# Patient Record
Sex: Male | Born: 1945 | Race: White | Hispanic: No | Smoking: Current every day smoker
Health system: Southern US, Community
[De-identification: ages and names within clinical notes are randomized; demographics above are authoritative.]

## PROBLEM LIST (undated history)

## (undated) DIAGNOSIS — C61 Malignant neoplasm of prostate: Secondary | ICD-10-CM

## (undated) HISTORY — PX: PROSTATE BIOPSY: SHX241

---

## 2021-01-03 DIAGNOSIS — I509 Heart failure, unspecified: Secondary | ICD-10-CM | POA: Insufficient documentation

## 2021-01-03 DIAGNOSIS — E782 Mixed hyperlipidemia: Secondary | ICD-10-CM | POA: Insufficient documentation

## 2021-01-03 DIAGNOSIS — J439 Emphysema, unspecified: Secondary | ICD-10-CM | POA: Insufficient documentation

## 2021-01-03 DIAGNOSIS — Z794 Long term (current) use of insulin: Secondary | ICD-10-CM | POA: Insufficient documentation

## 2021-02-20 DIAGNOSIS — M545 Low back pain, unspecified: Secondary | ICD-10-CM | POA: Insufficient documentation

## 2021-03-06 DIAGNOSIS — C61 Malignant neoplasm of prostate: Secondary | ICD-10-CM | POA: Insufficient documentation

## 2021-04-13 ENCOUNTER — Telehealth: Payer: Self-pay | Admitting: Oncology

## 2021-04-13 NOTE — Telephone Encounter (Signed)
Scheduled appt per 6/14 referral. Pt aware.

## 2021-04-18 ENCOUNTER — Other Ambulatory Visit: Payer: Self-pay | Admitting: Oncology

## 2021-04-18 ENCOUNTER — Other Ambulatory Visit: Payer: Self-pay | Admitting: Radiation Oncology

## 2021-04-18 ENCOUNTER — Inpatient Hospital Stay: Payer: Medicare HMO | Attending: Oncology | Admitting: Oncology

## 2021-04-18 ENCOUNTER — Ambulatory Visit
Admission: RE | Admit: 2021-04-18 | Discharge: 2021-04-18 | Disposition: A | Discharge status: Home / Self Care | Payer: Self-pay | Source: Ambulatory Visit | Attending: Radiation Oncology | Admitting: Radiation Oncology

## 2021-04-18 ENCOUNTER — Other Ambulatory Visit: Payer: Self-pay

## 2021-04-18 VITALS — BP 158/58 | HR 73 | Temp 97.6°F | Resp 18 | Wt 205.9 lb

## 2021-04-18 DIAGNOSIS — C61 Malignant neoplasm of prostate: Secondary | ICD-10-CM | POA: Diagnosis not present

## 2021-04-18 DIAGNOSIS — C775 Secondary and unspecified malignant neoplasm of intrapelvic lymph nodes: Secondary | ICD-10-CM | POA: Insufficient documentation

## 2021-04-18 DIAGNOSIS — Z5111 Encounter for antineoplastic chemotherapy: Secondary | ICD-10-CM | POA: Insufficient documentation

## 2021-04-18 NOTE — Progress Notes (Signed)
Reason for the request:    Prostate cancer  HPI: I was asked by Dr. Rosana Hoes to evaluate Eric Fischer for the evaluation of prostate cancer.  He is a 75 year old with a history of COPD, diabetes among other comorbid conditions.  He was evaluated by Dr. Tresa Endo in May 2022 for the presumed diagnosis of prostate cancer.  He had a screening PSA in April 2022 was 70.5.  His digital rectal examination showed a firm prostate and repeat PSA on Mar 08, 2021 was 61.14.  He underwent a staging PET scan on Mar 21, 2021 showed bilateral pelvic lymph node compatible with metastatic disease identified within the posterior gland.  There are 2 nodules within the superior segment of the lower lobe with are intermediate.  The lymph nodes involved including 6 mm left common iliac, left pelvic sidewall, 6 mm pelvic sidewall and a 5 mm right pelvic sidewall.  He reports a remote history of prostate cancer in the last 10 years.  He has been following with a urologist in [place] where a biopsy was performed and has been on active surveillance.  The details of his prostate cancer diagnosis not available to [place] at this point.  He did not receive any treatment however.  Clinically, he reports chronic complaints of back and hip discomfort as well as neuropathy.  He does report frequency of urination but no nocturia or dysuria.  His performance status is reasonable and ambulates with the help of cane.   He does not report any headaches, blurry vision, syncope or seizures. Does not report any fevers, chills or sweats.  Does not report any cough, wheezing or hemoptysis.  Does not report any chest pain, palpitation, orthopnea or leg edema.  Does not report any nausea, vomiting or abdominal pain.  Does not report any constipation or diarrhea.  Does not report any skeletal complaints.    Does not report frequency, urgency or hematuria.  Does not report any skin rashes or lesions. Does not report any heat or cold intolerance.  Does not report  any lymphadenopathy or petechiae.  Does not report any anxiety or depression.  Remaining review of systems is negative.    Past medical history significant for diabetes and COPD.  He has also history of neuropathy and currently on Neurontin.  Medication reviewed including aspirin, Lipitor Flexeril, Lasix, Neurontin and Lantus insulin.  He is also on Cozaar, Mobic, Toprol all and inhalers.  Social History   Socioeconomic History   Marital status: Divorced    Spouse name: Not on file   Number of children: Not on file   Years of education: Not on file   Highest education level: Not on file  Occupational History   Not on file  Tobacco Use   Smoking status: Not on file   Smokeless tobacco: Not on file  Substance and Sexual Activity   Alcohol use: Not on file   Drug use: Not on file   Sexual activity: Not on file  Other Topics Concern   Not on file  Social History Narrative   Not on file   Social Determinants of Health   Financial Resource Strain: Not on file  Food Insecurity: Not on file  Transportation Needs: Not on file  Physical Activity: Not on file  Stress: Not on file  Social Connections: Not on file  Intimate Partner Violence: Not on file  :  Pertinent items are noted in HPI.  Exam: Blood pressure (!) 158/58, pulse 73, temperature 97.6 F (36.4  C), temperature source Tympanic, resp. rate 18, weight 205 lb 14.4 oz (93.4 kg), SpO2 95 %. ECOG 1 General appearance: alert and cooperative appeared without distress. Head: atraumatic without any abnormalities. Eyes: conjunctivae/corneas clear. PERRL.  Sclera anicteric. Throat: lips, mucosa, and tongue normal; without oral thrush or ulcers. Resp: clear to auscultation bilaterally without rhonchi, wheezes or dullness to percussion. Cardio: regular rate and rhythm, S1, S2 normal, no murmur, click, rub or gallop GI: soft, non-tender; bowel sounds normal; no masses,  no organomegaly Skin: Skin color, texture, turgor normal.  No rashes or lesions Lymph nodes: Cervical, supraclavicular, and axillary nodes normal. Neurologic: Grossly normal without any motor, sensory or deep tendon reflexes. Musculoskeletal: No joint deformity or effusion.  IMPRESSION:  1. Examination is positive for tracer bilateral pelvic lymph nodes  compatible with metastatic adenopathy. Intense tracer uptake is  identified within the posterior prostate gland.  2. There are 2 nodules within the superior segment of left lower  lobe which are indeterminate. Only mild tracer uptake is associated  with these nodules. Cannot rule out primary bronchogenic carcinoma.  Recommend further evaluation with dedicated CT of the chest.  3. Signs of chronic granulomatous disease.  4. Infrarenal abdominal aortic aneurysm. Recommend follow-up  ultrasound every 3 years. This recommendation follows ACR consensus  guidelines: White Paper of the ACR Incidental Findings Committee II  on Vascular Findings. J Am Coll Radiol 2013; 10:789-794. Aortic  Atherosclerosis (ICD10-I70.0).  5. Aortic atherosclerosis and coronary artery calcifications. Aortic  Atherosclerosis (ICD10-I70.0  Assessment and Plan:   75 year old man with:  1.  Advanced prostate cancer diagnosed in May 2022.  He presented with a PSA of 61 and pelvic adenopathy based on a PSMA PET scan obtained in May 2022.  This indicates castration-sensitive disease.   Treatment options were discussed at this time and the natural course of this disease was reviewed.  He clearly has advanced disease but does not appear to have widespread metastasis including bone or visceral organs.  His advanced disease is limited to pelvic lymphadenopathy.  Systemic therapy remains the main backbone to treat his disease especially with androgen deprivation.  Complication associated with this option include fatigue, weight gain, osteoporosis and sexual dysfunction.  The plan is to start androgen deprivation therapy in the  immediate future.  He will receive Firmagon and switch to Lupron subsequently every 6 months.  The role for therapy escalation was also discussed in the form of androgen receptor pathway inhibitors versus chemotherapy.  This will be initiated at a later date once androgen deprivation therapy has commenced and has reasonable tolerance to it.  The rationale for also treating his primary tumor was also discussed.  He has limited metastatic disease including pelvic lymph nodes and it would be reasonable to consider treating his primary with radiation therapy.  I will make a referral to radiation oncology to consider treatment of his primary prostate cancer in the near future.  If radiation felt is not reasonable we will proceed with systemic therapy alone.  The role for obtaining tissue biopsy was also discussed but given now he has history I do not have any objection to proceeding with treatment without tissue biopsy.   2.  Androgen deprivation therapy: Risks and benefits of proceeding without discussed at this time.  Complications that include weight gain, hot flashes and others were reviewed.  He will receive Mills Koller in the future and transition into Lupron.  3.  Bone directed therapy: Recommended calcium and vitamin D supplements to combat osteoporosis  associated with androgen deprivation.  4.  Lower urinary tract symptoms: Appears to be reasonably controlled at this time.  He does have urinary frequency however.  5.  Follow-up: We will be in the next 2 months to follow his progress.  60  minutes were dedicated to this visit. The time was spent on reviewing laboratory data, imaging studies, discussing treatment options, and answering questions regarding future plan.     A copy of this consult has been forwarded to the requesting physician.

## 2021-04-20 ENCOUNTER — Telehealth: Payer: Self-pay | Admitting: Oncology

## 2021-04-20 NOTE — Telephone Encounter (Signed)
Scheduled per 06/22 los, called patients daughter. Patient will be notified of upcoming appointments.

## 2021-04-24 ENCOUNTER — Telehealth: Payer: Self-pay | Admitting: Radiation Oncology

## 2021-04-25 ENCOUNTER — Encounter: Payer: Self-pay | Admitting: Oncology

## 2021-04-25 ENCOUNTER — Inpatient Hospital Stay: Payer: Medicare HMO

## 2021-04-25 ENCOUNTER — Other Ambulatory Visit: Payer: Self-pay

## 2021-04-25 VITALS — BP 159/60 | HR 62 | Resp 18

## 2021-04-25 DIAGNOSIS — C61 Malignant neoplasm of prostate: Secondary | ICD-10-CM | POA: Diagnosis not present

## 2021-04-25 DIAGNOSIS — C775 Secondary and unspecified malignant neoplasm of intrapelvic lymph nodes: Secondary | ICD-10-CM | POA: Diagnosis not present

## 2021-04-25 DIAGNOSIS — Z5111 Encounter for antineoplastic chemotherapy: Secondary | ICD-10-CM | POA: Diagnosis present

## 2021-04-25 MED ORDER — DEGARELIX ACETATE(240 MG DOSE) 120 MG/VIAL ~~LOC~~ SOLR
240.0000 mg | Freq: Once | SUBCUTANEOUS | Status: AC
  Administered 2021-04-25: 240 mg via SUBCUTANEOUS
  Filled 2021-04-25: qty 6

## 2021-04-25 NOTE — Progress Notes (Signed)
Met with patient at registration on Lenise's behalf to obtain income for grant.  Patient approved for one-time $1000 grant to assist with personal expenses while going through treatment and an additional $200 grant for medication and gas cards only. He received 2 gift cards today. Discussed in detail expenses and how they are covered. He has a copy of the approval letter and expense sheet along with the Outpatient pharmacy information.  He has Lenise's card for any additional financial questions or concerns.

## 2021-04-25 NOTE — Progress Notes (Signed)
Called pt to introduce myself as his Arboriculturist.  Pt has 2 insurances so copay assistance shouldn't be needed.  I spoke to his sister and informed her of the [employer], went over what it covers and gave her the income requirement.  She said pt can use gas cards so he would like to apply, he will bring his proof of income to his visit today.  If approved I will give him an expense sheet and my card for any questions or concerns he may have in the future.

## 2021-04-25 NOTE — Patient Instructions (Signed)
Degarelix injection What is this medication? DEGARELIX (deg a REL ix) is used to treat men with advanced prostate cancer. This medicine may be used for other purposes; ask your health care provider orpharmacist if you have questions. COMMON BRAND NAME(S): Degarelix, Mills Koller What should I tell my care team before I take this medication? They need to know if you have any of these conditions: diabetes heart disease kidney disease liver disease low levels of potassium or magnesium in the blood osteoporosis an unusual or allergic reaction to degarelix, mannitol, other medicines, foods, dyes, or preservatives pregnant or trying to get pregnant breast-feeding How should I use this medication? This medicine is for injection under the skin. It is usually given by a healthcare professional in a hospital or clinic setting. If you get this medicine at home, you will be taught how to prepare and give this medicine. Use exactly as directed. Take your medicine at regularintervals. Do not take it more often than directed. It is important that you put your used needles and syringes in a special sharps container. Do not put them in a trash can. If you do not have a sharpscontainer, call your pharmacist or healthcare provider to get one. Talk to your pediatrician regarding the use of this medicine in children.Special care may be needed. Overdosage: If you think you have taken too much of this medicine contact apoison control center or emergency room at once. NOTE: This medicine is only for you. Do not share this medicine with others. What if I miss a dose? Try not to miss a dose. If you do miss a dose, call your doctor or health careprofessional for advice. What may interact with this medication? Do not take this medicine with any of the following medications: cisapride dronedarone pimozide thioridazine This medicine may also interact with the following medications: other medicines that prolong the QT  interval (abnormal heart rhythm) This list may not describe all possible interactions. Give your health care provider a list of all the medicines, herbs, non-prescription drugs, or dietary supplements you use. Also tell them if you smoke, drink alcohol, or use illegaldrugs. Some items may interact with your medicine. What should I watch for while using this medication? Visit your doctor or health care professional for regular checks on yourprogress and discuss any issues before you start taking this medicine. Do not rub or scratch injection site. There may be a lump at the injectionsite, or it may be red or sore for a few days after your dose. Your doctor or health care professional will need to monitor your hormone levels in your blood to check your response to treatment. Try to keep anyappointments for testing. What side effects may I notice from receiving this medication? Side effects that you should report to your doctor or health care professionalas soon as possible: allergic reactions like skin rash, itching or hives, swelling of the face, lips, or tongue fever or chills irregular heartbeat nausea and vomiting along with severe abdominal pain pain or difficulty passing urine pelvic pain or bloating signs and symptoms of high blood sugar such as being more thirsty or hungry or having to urinate more than normal. You may also feel very tired or have blurry vision Side effects that usually do not require medical attention (report to yourdoctor or health care professional if they continue or are bothersome): change in sex drive or performance constipation headache high blood pressure hot flashes (flushing of skin, increased sweating) itching, redness or mild pain at site where  injected joint pain trouble sleeping unusually weak or tired weight gain This list may not describe all possible side effects. Call your doctor for medical advice about side effects. You may report side effects to  FDA at1-800-FDA-1088. Where should I keep my medication? Keep out of the reach of children. This drug is usually given in a hospital or clinic and will not be stored athome. In rare cases, this medicine may be given at home. If you are using this medicine at home, you will be instructed on how to store this medicine. Throwaway any unused medicine after the expiration date on the label. NOTE: This sheet is a summary. It may not cover all possible information. If you have questions about this medicine, talk to your doctor, pharmacist, orhealth care provider.  2022 Elsevier/Gold Standard (2019-09-13 19:44:17)

## 2021-04-30 ENCOUNTER — Other Ambulatory Visit: Payer: Self-pay

## 2021-04-30 ENCOUNTER — Emergency Department (HOSPITAL_COMMUNITY)
Admission: EM | Admit: 2021-04-30 | Discharge: 2021-04-30 | Disposition: A | Discharge status: Home / Self Care | Payer: Medicare HMO | Attending: Emergency Medicine | Admitting: Emergency Medicine

## 2021-04-30 ENCOUNTER — Emergency Department (HOSPITAL_COMMUNITY): Payer: Medicare HMO

## 2021-04-30 DIAGNOSIS — I509 Heart failure, unspecified: Secondary | ICD-10-CM | POA: Insufficient documentation

## 2021-04-30 DIAGNOSIS — E1165 Type 2 diabetes mellitus with hyperglycemia: Secondary | ICD-10-CM | POA: Diagnosis not present

## 2021-04-30 DIAGNOSIS — Z794 Long term (current) use of insulin: Secondary | ICD-10-CM | POA: Insufficient documentation

## 2021-04-30 DIAGNOSIS — R222 Localized swelling, mass and lump, trunk: Secondary | ICD-10-CM | POA: Diagnosis not present

## 2021-04-30 DIAGNOSIS — N289 Disorder of kidney and ureter, unspecified: Secondary | ICD-10-CM | POA: Insufficient documentation

## 2021-04-30 DIAGNOSIS — Z7984 Long term (current) use of oral hypoglycemic drugs: Secondary | ICD-10-CM | POA: Diagnosis not present

## 2021-04-30 DIAGNOSIS — T50905A Adverse effect of unspecified drugs, medicaments and biological substances, initial encounter: Secondary | ICD-10-CM

## 2021-04-30 DIAGNOSIS — R140 Abdominal distension (gaseous): Secondary | ICD-10-CM | POA: Insufficient documentation

## 2021-04-30 DIAGNOSIS — J40 Bronchitis, not specified as acute or chronic: Secondary | ICD-10-CM | POA: Diagnosis not present

## 2021-04-30 DIAGNOSIS — C61 Malignant neoplasm of prostate: Secondary | ICD-10-CM

## 2021-04-30 DIAGNOSIS — J449 Chronic obstructive pulmonary disease, unspecified: Secondary | ICD-10-CM | POA: Diagnosis not present

## 2021-04-30 DIAGNOSIS — Z20822 Contact with and (suspected) exposure to covid-19: Secondary | ICD-10-CM | POA: Diagnosis not present

## 2021-04-30 DIAGNOSIS — R0602 Shortness of breath: Secondary | ICD-10-CM | POA: Diagnosis present

## 2021-04-30 DIAGNOSIS — E1142 Type 2 diabetes mellitus with diabetic polyneuropathy: Secondary | ICD-10-CM | POA: Diagnosis not present

## 2021-04-30 DIAGNOSIS — R7989 Other specified abnormal findings of blood chemistry: Secondary | ICD-10-CM | POA: Diagnosis not present

## 2021-04-30 DIAGNOSIS — Z7982 Long term (current) use of aspirin: Secondary | ICD-10-CM | POA: Insufficient documentation

## 2021-04-30 LAB — CBC WITH DIFFERENTIAL/PLATELET
Abs Immature Granulocytes: 0.04 10*3/uL (ref 0.00–0.07)
Basophils Absolute: 0.1 10*3/uL (ref 0.0–0.1)
Basophils Relative: 1 %
Eosinophils Absolute: 0.2 10*3/uL (ref 0.0–0.5)
Eosinophils Relative: 3 %
HCT: 44.2 % (ref 39.0–52.0)
Hemoglobin: 14.4 g/dL (ref 13.0–17.0)
Immature Granulocytes: 1 %
Lymphocytes Relative: 17 %
Lymphs Abs: 1.2 10*3/uL (ref 0.7–4.0)
MCH: 29.9 pg (ref 26.0–34.0)
MCHC: 32.6 g/dL (ref 30.0–36.0)
MCV: 91.9 fL (ref 80.0–100.0)
Monocytes Absolute: 0.5 10*3/uL (ref 0.1–1.0)
Monocytes Relative: 8 %
Neutro Abs: 4.9 10*3/uL (ref 1.7–7.7)
Neutrophils Relative %: 70 %
Platelets: 201 10*3/uL (ref 150–400)
RBC: 4.81 MIL/uL (ref 4.22–5.81)
RDW: 14.4 % (ref 11.5–15.5)
WBC: 6.9 10*3/uL (ref 4.0–10.5)
nRBC: 0 % (ref 0.0–0.2)

## 2021-04-30 LAB — RESP PANEL BY RT-PCR (FLU A&B, COVID) ARPGX2
Influenza A by PCR: NEGATIVE
Influenza B by PCR: NEGATIVE
SARS Coronavirus 2 by RT PCR: NEGATIVE

## 2021-04-30 LAB — COMPREHENSIVE METABOLIC PANEL
ALT: 11 U/L (ref 0–44)
AST: 16 U/L (ref 15–41)
Albumin: 3.2 g/dL — ABNORMAL LOW (ref 3.5–5.0)
Alkaline Phosphatase: 71 U/L (ref 38–126)
Anion gap: 6 (ref 5–15)
BUN: 26 mg/dL — ABNORMAL HIGH (ref 8–23)
CO2: 28 mmol/L (ref 22–32)
Calcium: 9.5 mg/dL (ref 8.9–10.3)
Chloride: 99 mmol/L (ref 98–111)
Creatinine, Ser: 1.32 mg/dL — ABNORMAL HIGH (ref 0.61–1.24)
GFR, Estimated: 56 mL/min — ABNORMAL LOW (ref >60)
Glucose, Bld: 347 mg/dL — ABNORMAL HIGH (ref 70–99)
Potassium: 5 mmol/L (ref 3.5–5.1)
Sodium: 133 mmol/L — ABNORMAL LOW (ref 135–145)
Total Bilirubin: 0.6 mg/dL (ref 0.3–1.2)
Total Protein: 6.8 g/dL (ref 6.5–8.1)

## 2021-04-30 LAB — LACTIC ACID, PLASMA
Lactic Acid, Venous: 1.9 mmol/L (ref 0.5–1.9)
Lactic Acid, Venous: 1.3 mmol/L (ref 0.5–1.9)

## 2021-04-30 LAB — TROPONIN I (HIGH SENSITIVITY)
Troponin I (High Sensitivity): 66 ng/L — ABNORMAL HIGH (ref <18)
Troponin I (High Sensitivity): 60 ng/L — ABNORMAL HIGH (ref <18)

## 2021-04-30 LAB — CBG MONITORING, ED: Glucose-Capillary: 191 mg/dL — ABNORMAL HIGH (ref 70–99)

## 2021-04-30 MED ORDER — AMOXICILLIN-POT CLAVULANATE 875-125 MG PO TABS: 1.0000 | ORAL_TABLET | Freq: Two times a day (BID) | ORAL | 0 refills | Status: AC

## 2021-04-30 MED ORDER — IOHEXOL 350 MG/ML SOLN
98.0000 mL | Freq: Once | INTRAVENOUS | Status: AC | PRN
  Administered 2021-04-30: 98 mL via INTRAVENOUS

## 2021-04-30 MED ORDER — TRAMADOL HCL 50 MG PO TABS: 50.0000 mg | ORAL_TABLET | Freq: Four times a day (QID) | ORAL | 0 refills | Status: AC | PRN

## 2021-04-30 MED ORDER — AMOXICILLIN-POT CLAVULANATE 875-125 MG PO TABS
1.0000 | ORAL_TABLET | Freq: Once | ORAL | Status: AC
  Administered 2021-04-30: 1 via ORAL
  Filled 2021-04-30: qty 1

## 2021-04-30 NOTE — ED Notes (Signed)
Dc instructions reviewed with pt. Pt verbalized understanding. PT DC>  

## 2021-04-30 NOTE — ED Provider Notes (Signed)
Emergency Medicine Provider Triage Evaluation Note  Eric Fischer , a 75 y.o. male  was evaluated in triage.  Pt complains of shortness of breath, lower abdominal pain.  Sent by oncologist due to worsening shortness of breath rule out blood clot in the lung.  Also reports redness pain and swelling in the lower abdomen he has been getting injections there for treatment of his prostate cancer.  Review of Systems  Positive: Shortness of breath, lower abdominal pain, redness, swelling Negative: Fever  Physical Exam  BP (!) 148/99   Pulse 64   Temp 98.2 F (36.8 C) (Oral)   Resp (!) 22   SpO2 97%  Gen:   Awake, chronically ill-appearing Resp:  Increased respiratory effort, coughing frequently MSK:   Moves extremities without difficulty  Other:  Abdomen is distended and tender, redness across the lower abdominal wall and pannus that is indurated and tender to the touch.  Medical Decision Making  Medically screening exam initiated at 2:34 PM.  Appropriate orders placed.  Eric Fischer was informed that the remainder of the evaluation will be completed by another provider, this initial triage assessment does not replace that evaluation, and the importance of remaining in the ED until their evaluation is complete.     Jacqlyn Larsen, PA-C 04/30/21 1441    Varney Biles, MD 05/01/21 [phone]

## 2021-04-30 NOTE — ED Notes (Signed)
Patient transported to CT 

## 2021-04-30 NOTE — Discharge Instructions (Addendum)
Use a warm compress or sit in a tub, 2 or 3 times a day to help the soreness of your abdomen.  Start the antibiotic prescription tomorrow.  Use the pain medicine as directed if needed.  Follow-up with your oncologist, Dr. Alen Blew as soon as possible to discuss your problems with the reaction to Lupron, as well as the CAT scan findings which indicated nodules in your chest which could be associated with the prostate cancer.

## 2021-04-30 NOTE — ED Provider Notes (Signed)
[facility] EMERGENCY DEPARTMENT Provider Note   CSN: [id number] Arrival date & time: 04/30/21  1400     History Chief Complaint  Patient presents with   Shortness of Breath    Eric Fischer is a 75 y.o. male.  HPI Presents for evaluation of shortness of breath.  He states he has COPD and is using his medications, without relief.  He contacted his oncologist who suggested that he come here for further evaluation and treatment.  He has soreness in his legs, left greater than right that he attributes to "walking.  He also states he had cellulitis of the legs, several months ago and was treated with an antibiotic.  He is currently receiving intravenous chemotherapy, for prostate cancer, last dose, 04/25/2021.  He has been referred for radiation oncology, but has not begun that yet. He denies fever, chills, cough, nausea or vomiting. He has nodules on his abdomen, which have been present for several days.  No past medical history on file.  Patient Active Problem List   Diagnosis Date Noted   Malignant neoplasm of prostate ([place]) 03/06/2021   Chronic bilateral low back pain 02/20/2021   Chronic congestive heart failure ([place]) 01/03/2021   Mixed dyslipidemia 01/03/2021   Pulmonary emphysema ([place]) 01/03/2021   Type 2 diabetes mellitus with diabetic polyneuropathy, with long-term current use of insulin ([place]) 01/03/2021        No family history on file.     Home Medications Prior to Admission medications   Medication Sig Start Date End Date Taking? Authorizing Provider  albuterol (VENTOLIN HFA) 108 (90 Base) MCG/ACT inhaler Inhale into the lungs. 04/16/21  Yes [provider]  amoxicillin-clavulanate (AUGMENTIN) 875-125 MG tablet Take 1 tablet by mouth 2 (two) times daily. One po bid x 7 days 04/30/21  Yes Daleen Bo, MD  furosemide (LASIX) 40 MG tablet Take by mouth. 03/09/21  Yes [provider]  insulin glargine (LANTUS) 100 UNIT/ML Solostar Pen  Inject into the skin. 04/16/21  Yes [provider]  linagliptin (TRADJENTA) 5 MG TABS tablet Take 1 tablet by mouth daily. 02/20/21  Yes [provider]  meloxicam (MOBIC) 15 MG tablet Take 1 tablet by mouth daily. 03/21/21  Yes [provider]  metFORMIN (GLUCOPHAGE) 500 MG tablet Take by mouth. 04/16/21  Yes [provider]  traMADol (ULTRAM) 50 MG tablet Take 1 tablet (50 mg total) by mouth every 6 (six) hours as needed for moderate pain. 04/30/21  Yes Daleen Bo, MD  aspirin 81 MG EC tablet Take by mouth.    [provider]  atorvastatin (LIPITOR) 40 MG tablet Take by mouth.    [provider]  gabapentin (NEURONTIN) 300 MG capsule Take by mouth.    [provider]  losartan (COZAAR) 25 MG tablet Take by mouth.    [provider]  metoprolol succinate (TOPROL-XL) 50 MG 24 hr tablet Take by mouth.    [provider]  umeclidinium-vilanterol (ANORO ELLIPTA) 62.5-25 MCG/INH AEPB Inhale 1 puff into the lungs daily.    [provider]    Allergies    Patient has no known allergies.  Review of Systems   Review of Systems  All other systems reviewed and are negative.  Physical Exam Updated Vital Signs BP (!) 163/103   Pulse 63   Temp 98.2 F (36.8 C) (Oral)   Resp 17   SpO2 99%   Physical Exam Vitals and nursing note reviewed.  Constitutional:  General: He is not in acute distress.    Appearance: He is well-developed. He is obese. He is not ill-appearing, toxic-appearing or diaphoretic.  HENT:     Head: Normocephalic and atraumatic.     Right Ear: External ear normal.     Left Ear: External ear normal.     Mouth/Throat:     Pharynx: No pharyngeal swelling or oropharyngeal exudate.  Eyes:     Conjunctiva/sclera: Conjunctivae normal.     Pupils: Pupils are equal, round, and reactive to light.  Neck:     Trachea: Phonation normal.  Cardiovascular:     Rate and Rhythm: Normal rate and  regular rhythm.     Heart sounds: Normal heart sounds.  Pulmonary:     Effort: Pulmonary effort is normal. No respiratory distress.     Breath sounds: Normal breath sounds. No stridor.     Comments: Decreased air movement bilaterally.  No respiratory distress.  No increased work of breathing no audible wheezes rales or rhonchi. Abdominal:     General: There is no distension.     Palpations: Abdomen is soft. There is no mass.     Tenderness: There is no abdominal tenderness.  Musculoskeletal:        General: No signs of injury. Normal range of motion.     Cervical back: Normal range of motion and neck supple.     Right lower leg: No edema.     Left lower leg: No edema.  Skin:    General: Skin is warm and dry.  Neurological:     Mental Status: He is alert and oriented to person, place, and time.     Cranial Nerves: No cranial nerve deficit.     Sensory: No sensory deficit.     Motor: No abnormal muscle tone.     Coordination: Coordination normal.  Psychiatric:        Mood and Affect: Mood normal.        Behavior: Behavior normal.        Thought Content: Thought content normal.        Judgment: Judgment normal.    ED Results / Procedures / Treatments   Labs (all labs ordered are listed, but only abnormal results are displayed) Labs Reviewed  COMPREHENSIVE METABOLIC PANEL - Abnormal; Notable for the following components:      Result Value   Sodium 133 (*)    Glucose, Bld 347 (*)    BUN 26 (*)    Creatinine, Ser 1.32 (*)    Albumin 3.2 (*)    GFR, Estimated 56 (*)    All other components within normal limits  CBG MONITORING, ED - Abnormal; Notable for the following components:   Glucose-Capillary 191 (*)    All other components within normal limits  TROPONIN I (HIGH SENSITIVITY) - Abnormal; Notable for the following components:   Troponin I (High Sensitivity) 66 (*)    All other components within normal limits  TROPONIN I (HIGH SENSITIVITY) - Abnormal; Notable for the  following components:   Troponin I (High Sensitivity) 60 (*)    All other components within normal limits  RESP PANEL BY RT-PCR (FLU A&B, COVID) ARPGX2  CBC WITH DIFFERENTIAL/PLATELET  LACTIC ACID, PLASMA  LACTIC ACID, PLASMA    EKG None  Radiology CT Angio Chest PE W and/or Wo Contrast  Result Date: 04/30/2021 CLINICAL DATA:  Concern for pulmonary embolus, high pretest probability. Shortness of breath. History of prostate cancer, technologist notes state active treatment. EXAM:  CT ANGIOGRAPHY CHEST WITH CONTRAST TECHNIQUE: Multidetector CT imaging of the chest was performed using the standard protocol during bolus administration of intravenous contrast. Multiplanar CT image reconstructions and MIPs were obtained to evaluate the vascular anatomy. CONTRAST:  71mL OMNIPAQUE IOHEXOL 350 MG/ML SOLN COMPARISON:  [facility] PET CT 03/21/2021 FINDINGS: Cardiovascular: There are no filling defects within the pulmonary arteries to suggest pulmonary embolus. Dilatation of the main pulmonary artery at 3.6 cm. Post median sternotomy and CABG. Diffuse thoracic aortic atherosclerosis. Cannot assess for dissection given phase of contrast tailored to pulmonary artery evaluation. Mild cardiomegaly. Aortic valve replacement. There are coronary artery calcifications. No pericardial effusion. Mediastinum/Nodes: Sequela prior granulomatous disease with calcified mediastinal and hilar lymph nodes. There is a 9 mm noncalcified right lower paratracheal node, similar to prior. No esophageal wall thickening. No visualized thyroid nodule. Lungs/Pleura: Mild emphysema. There is moderate bronchial thickening, most prominent involving the lower lobes were there is areas of mucous plugging and mucoid impaction. Left lower lobe pulmonary mass measures 3.7 x 2.0 cm, series 7, image 90 unchanged from recent PET. The adjacent left lower lobe nodule on PET is less well-defined on the current exam due to adjacent atelectasis, measuring  approximately 11 mm currently series 7, image 99. There is increasing surrounding atelectasis. The low right posterior lower lobe nodule on PET is partially obscured by motion on the current exam, series 7, image 125. Multiple calcified granuloma. No new pulmonary nodule or airspace disease. No pleural fluid. Upper Abdomen: Assessed on concurrent abdominopelvic CT, reported separately. Musculoskeletal: Median sternotomy. No acute osseous abnormalities. No focal bone lesion. Review of the MIP images confirms the above findings. IMPRESSION: 1. No pulmonary embolus. 2. Moderate bronchial thickening, most prominent involving the lower lobes were there are areas of mucous plugging and mucoid impaction. 3. Left lower lobe pulmonary mass measuring 3.7 x 2.0 cm, unchanged from recent PET-CT. The adjacent left lower lobe nodule on PET is less well-defined on the current exam due to adjacent atelectasis. The low right lower lobe nodule on PET is partially obscured by motion on the current exam. Differential considerations include primary bronchogenic malignancy or masslike pneumonia. Continued follow-up and additional investigation is likely warranted. Consider multi disciplinary thoracic peripheral as indicated. 4. Cardiomegaly with aortic valve replacement. Dilatation of the main pulmonary artery suggesting pulmonary arterial hypertension. 5. Aortic atherosclerosis and coronary artery calcifications. 6. Sequela prior granulomatous disease. Aortic Atherosclerosis (ICD10-I70.0) and Emphysema (ICD10-J43.9). Electronically Signed   By: Keith Rake M.D.   On: 04/30/2021 17:43   CT ABDOMEN PELVIS W CONTRAST  Result Date: 04/30/2021 CLINICAL DATA:  Abdominal pain and distension. Redness and swelling over lower abdomen. Prostate cancer with active treatment. EXAM: CT ABDOMEN AND PELVIS WITH CONTRAST TECHNIQUE: Multidetector CT imaging of the abdomen and pelvis was performed using the standard protocol following bolus  administration of intravenous contrast. CONTRAST:  18mL OMNIPAQUE IOHEXOL 350 MG/ML SOLN COMPARISON:  PET CT 03/21/2021 FINDINGS: Lower chest: Assessed on concurrent chest CT, including masslike opacity in the left lower lobe Hepatobiliary: Borderline hepatic steatosis without focal hepatic abnormality. Cholecystectomy. No biliary dilatation. Pancreas: No ductal dilatation or inflammation. Spleen: Normal in size without focal abnormality. Adrenals/Urinary Tract: Normal adrenal glands. No hydronephrosis or perinephric edema. Homogeneous enhancement with symmetric excretion on delayed phase imaging. Occasional areas of cortical scarring. Subcentimeter low-density in the upper right and lower left kidneys are too small to characterize, likely cyst. Partially distended urinary bladder without wall thickening. Stomach/Bowel: Partially distended stomach. There is no small bowel  obstruction or inflammation. Normal appendix. Diffuse colonic diverticulosis. No acute diverticulitis. No colonic wall thickening or inflammation. Normal appendix. Moderate colonic stool burden with mild tortuosity. Vascular/Lymphatic: Advanced aortic and branch atherosclerosis. There is both calcified noncalcified atheromatous plaque. 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 No acute aortic findings. Dense calcification of the bilateral common and external iliac arteries. High-grade stenosis or occlusion of the left superficial femoral artery, exam not tailored for arterial evaluation. The hypermetabolic lymph nodes on prior PET are not enlarged. Left common iliac node measures 5 mm, series 3 image 49, previously 6 mm. Previous right external iliac node measures 5 mm, series 3 image 66, previously 6 mm. Left external iliac node measures 5 mm, series 3, image 67, previously 8 mm. No enlarged lymph nodes in the abdomen or pelvis. Reproductive: Prominent heterogeneous prostate gland spanning 5.3 cm transverse. Other: Soft tissue edema and skin  thickening of the anterior abdominal wall extending from just above the umbilicus throughout the abdominal pannus. There is no soft tissue air or focal fluid collection. No abdominopelvic ascites. Musculoskeletal: No acute osseous abnormalities. No focal bone lesion is seen. IMPRESSION: 1. Soft tissue edema and skin thickening of the anterior abdominal wall extending from just above the umbilicus throughout the abdominal pannus, suspicious for cellulitis. No soft tissue air or focal fluid collection. 2. The hypermetabolic lymph nodes on prior PET are not enlarged by CT, and slightly smaller than on prior PET. 3. Colonic diverticulosis without acute inflammation. 4. Aortic atherosclerosis with mixed calcified noncalcified atheromatous plaque. 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 Follow-up ultrasound recommended in 3 years. Aortic Atherosclerosis (ICD10-I70.0). Electronically Signed   By: Keith Rake M.D.   On: 04/30/2021 17:50    Procedures Procedures   Medications Ordered in ED Medications  amoxicillin-clavulanate (AUGMENTIN) 875-125 MG per tablet 1 tablet (has no administration in time range)  iohexol (OMNIPAQUE) 350 MG/ML injection 98 mL (98 mLs Intravenous Contrast Given 04/30/21 1720)    ED Course  I have reviewed the triage vital signs and the nursing notes.  Pertinent labs & imaging results that were available during my care of the patient were reviewed by me and considered in my medical decision making (see chart for details).  Clinical Course as of 04/30/21 2347  Mon Apr 30, 2021  1931 Case was discussed with Dr. Marin Olp, on-call for oncology.  He recommends antibiotic of choice for abdominal process post administration of Lupron, within the week.  He agrees with follow-up with oncology as an outpatient for chest CT findings. [EW]    Clinical Course User Index [EW] Daleen Bo, MD   MDM Rules/Calculators/A&P                           Patient Vitals for the past 24 hrs:  BP  Temp Temp src Pulse Resp SpO2  04/30/21 1900 (!) 163/103 -- -- 63 17 99 %  04/30/21 1832 (!) 130/93 -- -- 77 16 98 %  04/30/21 1800 (!) 187/93 -- -- 61 20 90 %  04/30/21 1730 (!) 142/98 -- -- 64 (!) 31 98 %  04/30/21 1715 -- -- -- (!) 59 (!) 22 95 %  04/30/21 1703 (!) 187/83 -- -- (!) 43 (!) 22 95 %  04/30/21 1700 -- -- -- 67 -- --  04/30/21 1600 (!) 167/87 -- -- 61 (!) 22 96 %  04/30/21 1426 (!) 148/99 98.2 F (36.8 C) Oral 64 (!) 22 97 %  7:38 PM Reevaluation with update and discussion. After initial assessment and treatment, an updated evaluation reveals patient is fairly comfortable.  No respiratory distress.  After discussions with the patient's daughter, it appears the patient came here primarily for abdominal discomfort, following his Lupron injections.  Findings discussed and questions answered.  Daughter will have her partner pick the patient up. Daleen Bo   Medical Decision Making:  This patient is presenting for evaluation of shortness of breath, which does require a range of treatment options, and is a complaint that involves a moderate risk of morbidity and mortality. The differential diagnoses include acute pulmonary process.  Complications from prostate cancer or treatments. I decided to review old records, and in summary elderly male being treated for prostate cancer with therapy infusion, and Lupron injection.  I obtained additional historical information from daughter by telephone.  Clinical Laboratory Tests Ordered, included CBC, Metabolic panel, and troponin, lactate, viral panel . Review indicates elevated but on change delta troponin, glucose high, sodium low, BUN high, creatinine high, otherwise normal. Radiologic Tests Ordered, included CT abdomen, CT angio chest.  I independently Visualized: Radiograph images, which show findings concerning for pulmonary carcinoma and metastatic cancer of the prostate   Critical Interventions-clinical evaluation, laboratory  testing, radiography, observation reassessment  After These Interventions, the Patient was reevaluated and was found with symptoms primarily consistent with ongoing COPD.  He has a history of metastatic prostate cancer, currently being treated with chemotherapy infusion and planned radiation therapy.  He has an abnormal chest CT today without PE.  He has concern for bronchogenic carcinoma, metastatic versus primary.  This does not appear to have been assessed by his oncologist.  Pulmonary nodules and mass were present on the pet imaging, on 03/21/2021, at [facility].  Patient has additional history of chronic congestive heart failure, emphysema, and type 2 diabetes.  Symptoms today are primarily shortness of breath.  CT chest images consistent with bronchitis, possibly acute on chronic.  Laboratory testing indicate stable mildly elevated troponin, unchanged over time.  Additionally patient with hyperglycemia, azotemia, and renal insufficiency.  Compared with recent labs on 01/02/2021, mild AKI today, prior BUN and creatinine were 22/1.23.  Screening COVID and flu testing are negative.  Patient is stable respiratory wise, and can be treated with usual medicines for COPD.  Abdominal wall abnormalities related to recent Lupron injection.  This is a common reaction and at least 30%, and can be inflammatory versus infectious.  At this time I doubt infection, patient will be covered with Augmentin to prevent that.  He is stable for discharge.  He will also be given tramadol for pain management.  He is encouraged to follow-up with his oncologist soon as possible because of the abnormal chest CT and ongoing management including follow-up care for Lupron reaction.  CRITICAL CARE-no Performed by: Daleen Bo   Nursing Notes Reviewed/ Care Coordinated Applicable Imaging Reviewed Interpretation of Laboratory Data incorporated into ED treatment  The patient appears reasonably screened and/or stabilized for  discharge and I doubt any other medical condition or other [facility] requiring further screening, evaluation, or treatment in the ED at this time prior to discharge.  Plan: Home Medications-continue usual; Home Treatments-warm compress to abdomen; return here if the recommended treatment, does not improve the symptoms; Recommended follow up-oncology follow-up for complications from recent Lupron injection and ongoing management for prostate cancer and abnormal chest CT        Final Clinical Impression(s) / ED Diagnoses Final diagnoses:  None  Rx / DC Orders ED Discharge Orders          Ordered    amoxicillin-clavulanate (AUGMENTIN) 875-125 MG tablet  2 times daily        04/30/21 1936    traMADol (ULTRAM) 50 MG tablet  Every 6 hours PRN        04/30/21 1936             Daleen Bo, MD 04/30/21 2349

## 2021-04-30 NOTE — ED Triage Notes (Signed)
Patient sent by oncologist due to shortness of breath and having pain to lower abdomen and penis. Currently on treatment for cancer. States concerned for blood clot. States skin is raw to under belly.

## 2021-05-15 ENCOUNTER — Other Ambulatory Visit: Payer: Self-pay | Admitting: Oncology

## 2021-05-17 NOTE — Progress Notes (Signed)
Radiation Oncology         [phone] ________________________________  Initial Outpatient Consultation  Name: Eric Fischer MRN: [id number]  Date: 05/21/2021  DOB: 1946-07-05  CC:Pcp, No  Wyatt Portela, MD   REFERRING PHYSICIAN: Wyatt Portela, MD  DIAGNOSIS: 75 y.o. 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    ICD-10-CM   1. Malignant neoplasm of prostate ([place])  [facility] Ambulatory referral to Social Work    Ambulatory referral to Social Work      HISTORY OF PRESENT ILLNESS: Eric Fischer is a 75 y.o. male with a diagnosis of prostate cancer. He was noted to have an elevated PSA in 2012 by his primary care physician in [place] where he was living at the time.  Accordingly, he was referred for evaluation in urology by Dr. Trenda Moots and proceeded to transrectal ultrasound with 12 biopsies of the prostate on 06/19/11.   Out of 11 core biopsies, 5 were positive.  The maximum Gleason score was 3+3, and this was seen in 1 core on the right and 4 cores on the left.  He elected to proceed with active surveillance at that time and continued with a rising PSA as below.   He had restaging CT A/P and bone scan in November 2021, under the direction of Dr. Evette Doffing (urology) while he was still living in [place] and both of these studies were negative for any evidence of metastatic disease.  He has since relocated to [place], [place] and had a elevated PSA at 70.5 on screening labs performed in April 2022.  He was referred to Dr. Rosana Hoes who met with him in consult on 03/08/2021, digital rectal exam performed at that time felt firm and fixed.  A repeat PSA was performed that same day and remained significantly elevated at 61.14.  He had a PSMA PET scan on 03/21/2021 for disease restaging and this showed multiple bilateral tracer avid pelvic lymph nodes compatible with metastatic lymphadenopathy as well as  intense activity in the posterior prostate and 2 nodules in the left lower lobe which were indeterminant with mild activity only.  There was no evidence of skeletal metastases.  The highest positive pelvic node was along the left common iliac artery just inferior to the confluence of the aorta at the L4/L5 disk space level.    He met with Dr. Alen Blew in consult on 04/18/2021 and has recently started ADT on 04/25/21 and is currently under consideration for therapy escalation in the form of androgen receptor pathway inhibitors versus chemotherapy pending his tolerance of the ADT and response to treatment. Given the limited metastatic disease involving the pelvic lymph nodes, it was felt that it would be reasonable to consider treating his primary with radiation therapy.  Therefore, he has kindly been referred today for discussion of potential radiation treatment options.   PREVIOUS RADIATION THERAPY: No  PAST MEDICAL HISTORY:  Past Medical History:  Diagnosis Date   Prostate cancer ([place])       Chronic bilateral low back pain 02/20/2021   Type 2 diabetes mellitus with diabetic polyneuropathy, with long-term current use of insulin ([place]) 01/03/2021   Essential hypertension 01/03/2021   Mixed dyslipidemia 01/03/2021   Cellulitis of lower extremity 01/03/2021   Chronic congestive heart failure ([place]) 01/03/2021   Pulmonary emphysema ([place]) 01/03/2021  PAST SURGICAL HISTORY: Past Surgical History:  Procedure Laterality Date   PROSTATE BIOPSY  FAMILY HISTORY: History reviewed. No pertinent family history.  COPD Mother   COPD Father   Diabetes Brother   Diabetes Paternal Uncle  SOCIAL HISTORY:  Social History   Socioeconomic History   Marital status: Divorced    Spouse name: Not on file   Number of children: Not on file   Years of education: Not on file   Highest education level: Not on file  Occupational History   Not on file  Tobacco Use   Smoking status: Every Day     Packs/day: 1.00    Years: 50.00    Pack years: 50.00    Types: Cigarettes    Start date: 05/14/1951   Smokeless tobacco: Never  Substance and Sexual Activity   Alcohol use: Not on file   Drug use: Not on file   Sexual activity: Not on file  Other Topics Concern   Not on file  Social History Narrative   Not on file   Social Determinants of Health   Financial Resource Strain: Not on file  Food Insecurity: Not on file  Transportation Needs: Not on file  Physical Activity: Not on file  Stress: Not on file  Social Connections: Not on file  Intimate Partner Violence: Not on file    ALLERGIES: Patient has no known allergies.  MEDICATIONS:  Current Outpatient Medications  Medication Sig Dispense Refill   albuterol (VENTOLIN HFA) 108 (90 Base) MCG/ACT inhaler Inhale into the lungs.     furosemide (LASIX) 40 MG tablet Take by mouth.     gabapentin (NEURONTIN) 300 MG capsule Take by mouth.     insulin glargine (LANTUS) 100 UNIT/ML Solostar Pen Inject into the skin.     metFORMIN (GLUCOPHAGE) 500 MG tablet Take by mouth.     metoprolol succinate (TOPROL-XL) 50 MG 24 hr tablet Take by mouth.     traMADol (ULTRAM) 50 MG tablet Take 1 tablet (50 mg total) by mouth every 6 (six) hours as needed for moderate pain. 20 tablet 0   umeclidinium-vilanterol (ANORO ELLIPTA) 62.5-25 MCG/INH AEPB Inhale 1 puff into the lungs daily.     amoxicillin-clavulanate (AUGMENTIN) 875-125 MG tablet Take 1 tablet by mouth 2 (two) times daily. One po bid x 7 days (Patient not taking: Reported on 05/21/2021) 14 tablet 0   aspirin 81 MG EC tablet Take by mouth. (Patient not taking: Reported on 05/21/2021)     atorvastatin (LIPITOR) 40 MG tablet Take by mouth. (Patient not taking: Reported on 05/21/2021)     linagliptin (TRADJENTA) 5 MG TABS tablet Take 1 tablet by mouth daily. (Patient not taking: Reported on 05/21/2021)     losartan (COZAAR) 25 MG tablet Take by mouth. (Patient not taking: Reported on 05/21/2021)      meloxicam (MOBIC) 15 MG tablet Take 1 tablet by mouth daily. (Patient not taking: Reported on 05/21/2021)     No current facility-administered medications for this encounter.    REVIEW OF SYSTEMS:  On review of systems, the patient reports that he is doing well overall. He denies any chest pain, shortness of breath, cough, fevers, chills, night sweats, unintended weight changes. He denies any bowel disturbances, and denies abdominal pain, nausea or vomiting. He denies any new musculoskeletal or joint aches or pains. His IPSS and SHIM were recorded  His IPSS was Total Score: 18,  His SHIM: 12,     PHYSICAL EXAM:  Wt Readings from Last 3 Encounters:  05/21/21 204 lb 3.2 oz (92.6 kg)  04/18/21 205 lb 14.4  oz (93.4 kg)   Temp Readings from Last 3 Encounters:  05/21/21 98.2 F (36.8 C)  04/30/21 98.5 F (36.9 C) (Oral)  04/18/21 97.6 F (36.4 C) (Tympanic)   BP Readings from Last 3 Encounters:  05/21/21 (!) 143/53  04/30/21 (!) 151/85  04/25/21 (!) 159/60   Pulse Readings from Last 3 Encounters:  05/21/21 69  04/30/21 61  04/25/21 62   Pain Assessment Pain Score: 7  Pain Frequency: Constant Pain Loc: Back/10  In general this is a well appearing man in no acute distress. He's alert and oriented x4 and appropriate throughout the examination. Cardiopulmonary assessment is negative for acute distress, and he exhibits normal effort.     KPS = 100  100 - Normal; no complaints; no evidence of disease. 90   - Able to carry on normal activity; minor signs or symptoms of disease. 80   - Normal activity with effort; some signs or symptoms of disease. 44   - Cares for self; unable to carry on normal activity or to do active work. 60   - Requires occasional assistance, but is able to care for most of his personal needs. 50   - Requires considerable assistance and frequent medical care. 26   - Disabled; requires special care and assistance. 33   - Severely disabled; hospital admission  is indicated although death not imminent. 28   - Very sick; hospital admission necessary; active supportive treatment necessary. 10   - Moribund; fatal processes progressing rapidly. 0     - Dead  Karnofsky DA, Abelmann Lund, Craver LS and Burchenal JH [phone]) The use of the nitrogen mustards in the palliative treatment of carcinoma: with particular reference to bronchogenic carcinoma Cancer 1 634-56  LABORATORY DATA:  Lab Results  Component Value Date   WBC 6.9 04/30/2021   HGB 14.4 04/30/2021   HCT 44.2 04/30/2021   MCV 91.9 04/30/2021   PLT 201 04/30/2021   Lab Results  Component Value Date   NA 133 (L) 04/30/2021   K 5.0 04/30/2021   CL 99 04/30/2021   CO2 28 04/30/2021   Lab Results  Component Value Date   ALT 11 04/30/2021   AST 16 04/30/2021   ALKPHOS 71 04/30/2021   BILITOT 0.6 04/30/2021     RADIOGRAPHY: CT Angio Chest PE W and/or Wo Contrast  Result Date: 04/30/2021 CLINICAL DATA:  Concern for pulmonary embolus, high pretest probability. Shortness of breath. History of prostate cancer, technologist notes state active treatment. EXAM: CT ANGIOGRAPHY CHEST WITH CONTRAST TECHNIQUE: Multidetector CT imaging of the chest was performed using the standard protocol during bolus administration of intravenous contrast. Multiplanar CT image reconstructions and MIPs were obtained to evaluate the vascular anatomy. CONTRAST:  35m OMNIPAQUE IOHEXOL 350 MG/ML SOLN COMPARISON:  Pylarify PET CT 03/21/2021 FINDINGS: Cardiovascular: There are no filling defects within the pulmonary arteries to suggest pulmonary embolus. Dilatation of the main pulmonary artery at 3.6 cm. Post median sternotomy and CABG. Diffuse thoracic aortic atherosclerosis. Cannot assess for dissection given phase of contrast tailored to pulmonary artery evaluation. Mild cardiomegaly. Aortic valve replacement. There are coronary artery calcifications. No pericardial effusion. Mediastinum/Nodes: Sequela prior granulomatous  disease with calcified mediastinal and hilar lymph nodes. There is a 9 mm noncalcified right lower paratracheal node, similar to prior. No esophageal wall thickening. No visualized thyroid nodule. Lungs/Pleura: Mild emphysema. There is moderate bronchial thickening, most prominent involving the lower lobes were there is areas of mucous plugging and mucoid impaction. Left lower lobe  pulmonary mass measures 3.7 x 2.0 cm, series 7, image 90 unchanged from recent PET. The adjacent left lower lobe nodule on PET is less well-defined on the current exam due to adjacent atelectasis, measuring approximately 11 mm currently series 7, image 99. There is increasing surrounding atelectasis. The low right posterior lower lobe nodule on PET is partially obscured by motion on the current exam, series 7, image 125. Multiple calcified granuloma. No new pulmonary nodule or airspace disease. No pleural fluid. Upper Abdomen: Assessed on concurrent abdominopelvic CT, reported separately. Musculoskeletal: Median sternotomy. No acute osseous abnormalities. No focal bone lesion. Review of the MIP images confirms the above findings. IMPRESSION: 1. No pulmonary embolus. 2. Moderate bronchial thickening, most prominent involving the lower lobes were there are areas of mucous plugging and mucoid impaction. 3. Left lower lobe pulmonary mass measuring 3.7 x 2.0 cm, unchanged from recent PET-CT. The adjacent left lower lobe nodule on PET is less well-defined on the current exam due to adjacent atelectasis. The low right lower lobe nodule on PET is partially obscured by motion on the current exam. Differential considerations include primary bronchogenic malignancy or masslike pneumonia. Continued follow-up and additional investigation is likely warranted. Consider multi disciplinary thoracic peripheral as indicated. 4. Cardiomegaly with aortic valve replacement. Dilatation of the main pulmonary artery suggesting pulmonary arterial hypertension. 5.  Aortic atherosclerosis and coronary artery calcifications. 6. Sequela prior granulomatous disease. Aortic Atherosclerosis (ICD10-I70.0) and Emphysema (ICD10-J43.9). Electronically Signed   By: Keith Rake M.D.   On: 04/30/2021 17:43   CT ABDOMEN PELVIS W CONTRAST  Result Date: 04/30/2021 CLINICAL DATA:  Abdominal pain and distension. Redness and swelling over lower abdomen. Prostate cancer with active treatment. EXAM: CT ABDOMEN AND PELVIS WITH CONTRAST TECHNIQUE: Multidetector CT imaging of the abdomen and pelvis was performed using the standard protocol following bolus administration of intravenous contrast. CONTRAST:  25m OMNIPAQUE IOHEXOL 350 MG/ML SOLN COMPARISON:  PET CT 03/21/2021 FINDINGS: Lower chest: Assessed on concurrent chest CT, including masslike opacity in the left lower lobe Hepatobiliary: Borderline hepatic steatosis without focal hepatic abnormality. Cholecystectomy. No biliary dilatation. Pancreas: No ductal dilatation or inflammation. Spleen: Normal in size without focal abnormality. Adrenals/Urinary Tract: Normal adrenal glands. No hydronephrosis or perinephric edema. Homogeneous enhancement with symmetric excretion on delayed phase imaging. Occasional areas of cortical scarring. Subcentimeter low-density in the upper right and lower left kidneys are too small to characterize, likely cyst. Partially distended urinary bladder without wall thickening. Stomach/Bowel: Partially distended stomach. There is no small bowel obstruction or inflammation. Normal appendix. Diffuse colonic diverticulosis. No acute diverticulitis. No colonic wall thickening or inflammation. Normal appendix. Moderate colonic stool burden with mild tortuosity. Vascular/Lymphatic: Advanced aortic and branch atherosclerosis. There is both calcified noncalcified atheromatous plaque. 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 No acute aortic findings. Dense calcification of the bilateral common and external iliac arteries.  High-grade stenosis or occlusion of the left superficial femoral artery, exam not tailored for arterial evaluation. The hypermetabolic lymph nodes on prior PET are not enlarged. Left common iliac node measures 5 mm, series 3 image 49, previously 6 mm. Previous right external iliac node measures 5 mm, series 3 image 66, previously 6 mm. Left external iliac node measures 5 mm, series 3, image 67, previously 8 mm. No enlarged lymph nodes in the abdomen or pelvis. Reproductive: Prominent heterogeneous prostate gland spanning 5.3 cm transverse. Other: Soft tissue edema and skin thickening of the anterior abdominal wall extending from just above the umbilicus throughout the abdominal pannus. There is  no soft tissue air or focal fluid collection. No abdominopelvic ascites. Musculoskeletal: No acute osseous abnormalities. No focal bone lesion is seen. IMPRESSION: 1. Soft tissue edema and skin thickening of the anterior abdominal wall extending from just above the umbilicus throughout the abdominal pannus, suspicious for cellulitis. No soft tissue air or focal fluid collection. 2. The hypermetabolic lymph nodes on prior PET are not enlarged by CT, and slightly smaller than on prior PET. 3. Colonic diverticulosis without acute inflammation. 4. Aortic atherosclerosis with mixed calcified noncalcified atheromatous plaque. 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 Follow-up ultrasound recommended in 3 years. Aortic Atherosclerosis (ICD10-I70.0). Electronically Signed   By: Keith Rake M.D.   On: 04/30/2021 17:50      IMPRESSION/PLAN: 1. 74 y.o. 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  We discussed the patient's workup and outlined the nature of prostate cancer in this setting. The patient's T stage, Gleason's score, and PSA put him into the high risk group and there is evidence of oligometastatic disease involving the pelvic  lymph nodes on recent PSMA PET scan. Accordingly, he is eligible for a ADT in combination with 8 weeks of external radiation for more durable control of his disease although there is a very low likelihood for cure given that the disease has already metastasized. We discussed the available radiation techniques, and focused on the details and logistics of delivery as well as the risks, benefits, short and long-term effects associated with radiotherapy. We also detailed the role of ADT in the treatment of high risk prostate cancer and outlined the associated side effects that could be expected with this therapy.  He has already started ADT on 04/25/2021 and overall, is tolerating this fairly well.  He appears to have a good understanding of his disease and our treatment recommendations which are of curative intent though he understands that there is a low likelihood for cure but a good likelihood that we can achieve more durable, long-term control of his disease with this recommended treatment.  He was encouraged to ask questions that were answered to his stated satisfaction.  At the conclusion of our conversation, the patient is interested in moving forward with IMRT to the pelvis with boost to the prostate and PET-positive nodes.  He is interested in pursuing treatment closer to home at [facility].  We will place a referral to Dr. Orlene Erm.  I personally spent 60 minutes in this encounter including chart review, reviewing radiological studies, meeting face-to-face with the patient, entering orders and completing documentation.  ------------------------------------------------   Tyler Pita, MD [place]: [phone]  Fax: [phone] [email or web address]  Skype  LinkedIn

## 2021-05-17 NOTE — Progress Notes (Signed)
GU Location of Tumor / Histology:    If Prostate Cancer, Gleason Score is (3 + 3) and PSA is (70.90)  Joesph R Galentine presented  months ago with signs/symptoms of: no  Biopsies of prostate (if applicable) revealed: yes, adenocarcinoma  Past/Anticipated interventions by urology, if any: no  Past/Anticipated interventions by medical oncology, if any: yes eligard  Weight changes, if any: yes  Bowel/Bladder complaints, if any: yes   Nausea/Vomiting, if any: no  Pain issues, if any:  yes  SAFETY ISSUES: Prior radiation? no Pacemaker/ICD? no Possible current pregnancy? no Is the patient on methotrexate? no  Current Complaints / other details:  Patient has a pig valve in place. Has chronic back pain. Has not ben started on some of his medicine since he moved here 5 months. Is going to make an appointment to see PCP to get them restarted. Has an area to upper right arm that daughter states was a large blister is getting better now.

## 2021-05-21 ENCOUNTER — Encounter: Payer: Self-pay | Admitting: Radiation Oncology

## 2021-05-21 ENCOUNTER — Ambulatory Visit
Admission: RE | Admit: 2021-05-21 | Discharge: 2021-05-21 | Disposition: A | Discharge status: Home / Self Care | Payer: Medicare HMO | Source: Ambulatory Visit | Attending: Radiation Oncology | Admitting: Radiation Oncology

## 2021-05-21 ENCOUNTER — Other Ambulatory Visit: Payer: Self-pay

## 2021-05-21 VITALS — BP 143/53 | HR 69 | Temp 98.2°F | Resp 24 | Ht 68.0 in | Wt 204.2 lb

## 2021-05-21 DIAGNOSIS — F17210 Nicotine dependence, cigarettes, uncomplicated: Secondary | ICD-10-CM | POA: Diagnosis not present

## 2021-05-21 DIAGNOSIS — E785 Hyperlipidemia, unspecified: Secondary | ICD-10-CM | POA: Insufficient documentation

## 2021-05-21 DIAGNOSIS — Z79899 Other long term (current) drug therapy: Secondary | ICD-10-CM | POA: Diagnosis not present

## 2021-05-21 DIAGNOSIS — E119 Type 2 diabetes mellitus without complications: Secondary | ICD-10-CM | POA: Diagnosis not present

## 2021-05-21 DIAGNOSIS — M545 Low back pain, unspecified: Secondary | ICD-10-CM | POA: Diagnosis not present

## 2021-05-21 DIAGNOSIS — J439 Emphysema, unspecified: Secondary | ICD-10-CM | POA: Insufficient documentation

## 2021-05-21 DIAGNOSIS — I10 Essential (primary) hypertension: Secondary | ICD-10-CM | POA: Insufficient documentation

## 2021-05-21 DIAGNOSIS — C61 Malignant neoplasm of prostate: Secondary | ICD-10-CM | POA: Diagnosis not present

## 2021-05-21 DIAGNOSIS — Z923 Personal history of irradiation: Secondary | ICD-10-CM | POA: Diagnosis not present

## 2021-05-21 DIAGNOSIS — C775 Secondary and unspecified malignant neoplasm of intrapelvic lymph nodes: Secondary | ICD-10-CM | POA: Insufficient documentation

## 2021-05-21 DIAGNOSIS — Z7984 Long term (current) use of oral hypoglycemic drugs: Secondary | ICD-10-CM | POA: Insufficient documentation

## 2021-05-21 DIAGNOSIS — I509 Heart failure, unspecified: Secondary | ICD-10-CM | POA: Insufficient documentation

## 2021-05-21 HISTORY — DX: Malignant neoplasm of prostate: C61

## 2021-05-22 ENCOUNTER — Ambulatory Visit: Payer: Medicare HMO

## 2021-05-23 ENCOUNTER — Encounter: Payer: Self-pay | Admitting: *Deleted

## 2021-05-23 NOTE — Progress Notes (Signed)
[facility] Clinical Social Work  Initial Assessment   Eric Fischer is a 75 y.o. year old male contacted by phone. Clinical Social Work was referred by distress screening for assessment of psychosocial needs.   SDOH (Social Determinants of Health) assessments performed: Yes   Distress Screen completed: Yes ONCBCN DISTRESS SCREENING 05/21/2021  Screening Type Initial Screening  Distress experienced in past week (1-10) 9  Practical problem type Transportation  Emotional problem type Depression;Isolation/feeling alone;Feeling hopeless;Boredom  Physical Problem type Pain;Sleep/insomnia;Getting around;Breathing;Tingling hands/feet;Skin dry/itchy  Physician notified of physical symptoms Yes  Referral to clinical social work Yes  Referral to support programs Yes      Family/Social Information:  Housing Arrangement: patient lives alone, lives next door to daughter/family Family members/support persons in your life? Family, primarily daughter Transportation concerns: yes, truck is broken down, relies on transportation from daughter  Employment: Retired. Income source: Paediatric nurse concerns: Yes, current concerns Type of concernCustomer service manager access concerns: yes, sometimes runs out food before he receives next social security check Religious or spiritual practice: yes, "God will provide for me" Medication Concerns: no  Services Currently in place:  no  Coping/ Adjustment to diagnosis: Patient understands treatment plan and what happens next? Not initially- CSW discussed difference in chemotherapy and radiation. CSW provided brief information on what to expect and encouraged him to speak more in detail with Dr. Orlene Erm Concerns about diagnosis and/or treatment: How I will pay for the services I need and current prognosis- "focused on if I can beat this and keep living" Patient reported stressors: Finances, Transportation, Adjusting to my illness, and Facing  my mortality Hopes and priorities: patient's "hope" is to overcome cancer and live a long quality life Patient enjoys  "tinkering" around the house Current coping skills/ strengths: Capable of independent living, Motivation for treatment/growth, and Supportive family/friends    SUMMARY: Current SDOH Barriers:  Financial constraints related to fixed income, Limited social support, Transportation, and Limited access to food  Clinical Social Work Clinical Goal(s):  patient will follow up with patient resource specialist at [facility] as directed by SW  Interventions: Discussed common feeling and emotions when being diagnosed with cancer, and the importance of support during treatment Informed patient of the support team roles and support services at [facility] Provided CSW contact information and encouraged patient to call with any questions or concerns Provided education regarding radiation treatment and emotional suport   Follow Up Plan: Patient will contact CSW with any support or resource needs Patient verbalizes understanding of plan: Yes    Kennith [facility] , LCSW

## 2021-05-24 NOTE — Progress Notes (Signed)
Spoke with patient regarding transportation. He states that he has a ride to his appointments, just needs help paying for gas. He is already enrolled into the [employer], provided him with the gift cards to help with cost.

## 2021-06-27 ENCOUNTER — Other Ambulatory Visit: Payer: Self-pay | Admitting: Oncology

## 2021-06-27 ENCOUNTER — Ambulatory Visit: Payer: Medicare HMO

## 2021-06-27 ENCOUNTER — Inpatient Hospital Stay: Payer: Medicare HMO | Admitting: Oncology

## 2021-06-27 ENCOUNTER — Inpatient Hospital Stay: Payer: Medicare HMO

## 2021-09-04 IMAGING — CT CT ANGIO CHEST
2 of 7 series · 17 of 46 positions shown · IV contrast (APPLIED)
Comparison: Pylarify PET CT 03/21/2021

CLINICAL DATA: Concern for pulmonary embolus, high pretest
probability. Shortness of breath. History of prostate cancer,
technologist notes state active treatment.

EXAM:
CT ANGIOGRAPHY CHEST WITH CONTRAST
TECHNIQUE: Multidetector CT imaging of the chest was performed using the
standard protocol during bolus administration of intravenous
contrast. Multiplanar CT image reconstructions and MIPs were
obtained to evaluate the vascular anatomy.
CONTRAST:  98mL OMNIPAQUE IOHEXOL 350 MG/ML SOLN

[Series 6: thins · axial · 0.85mm/px · z∈[+1106,+1384]mm · 14 of 448 slices shown]
[im 25/448  lung]
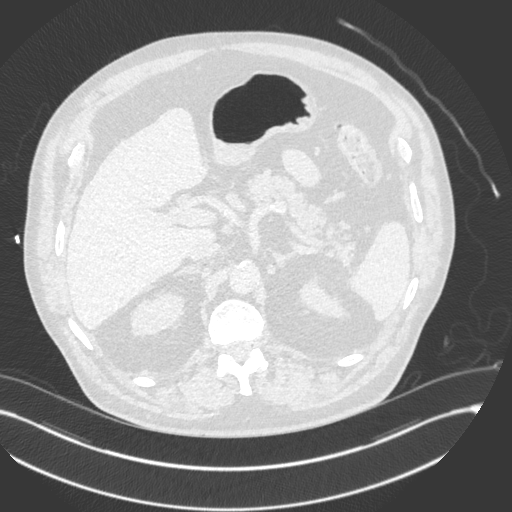
[im 50/448  soft-tissue]
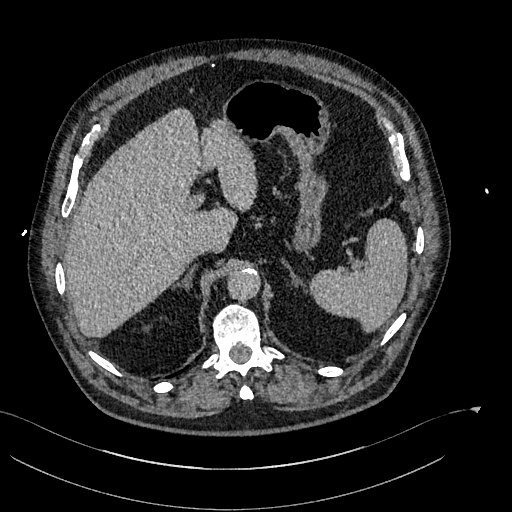
[im 100/448  lung]
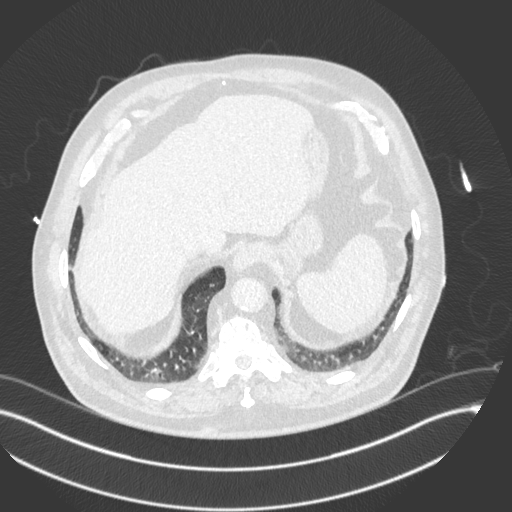
[im 125/448  soft-tissue]
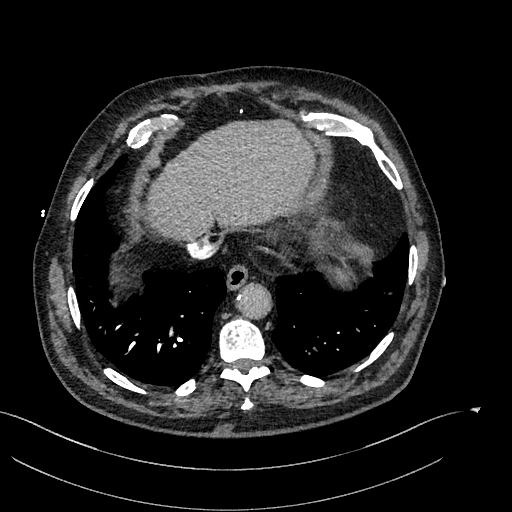
[im 150/448  lung]
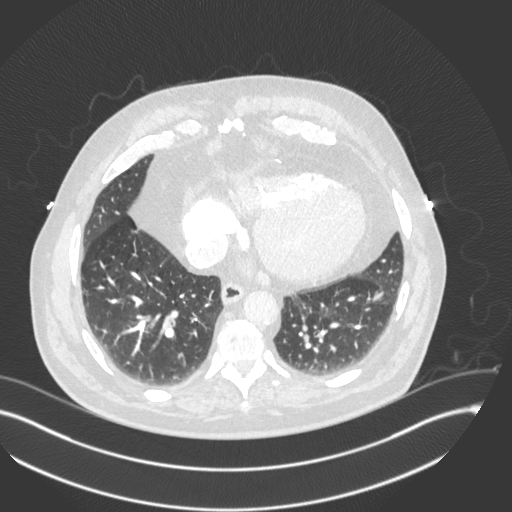
[im 174/448  soft-tissue]
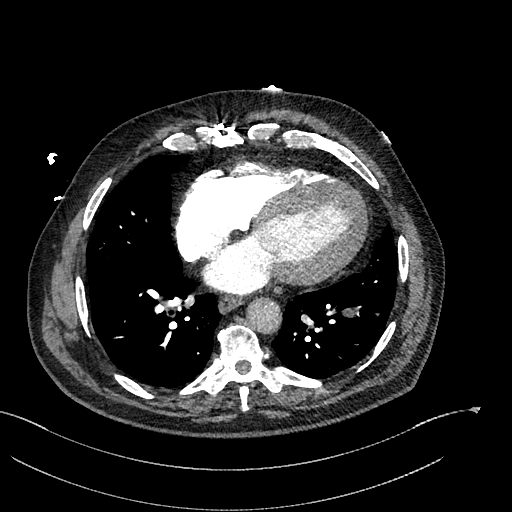
[im 199/448  lung]
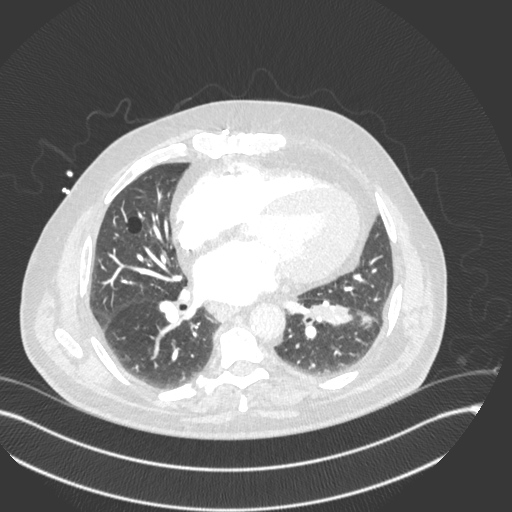
[im 249/448  soft-tissue]
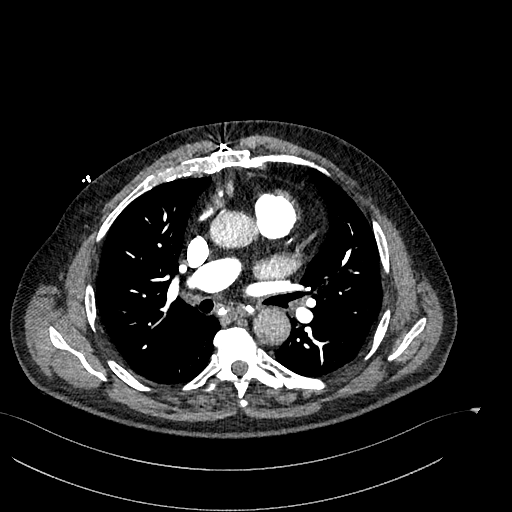
[im 274/448  lung]
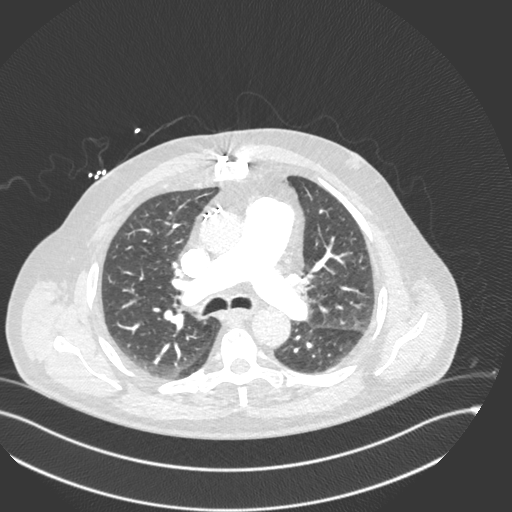
[im 299/448  soft-tissue]
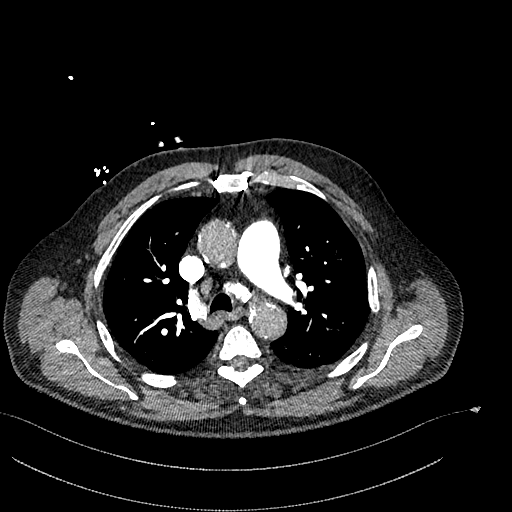
[im 323/448  lung]
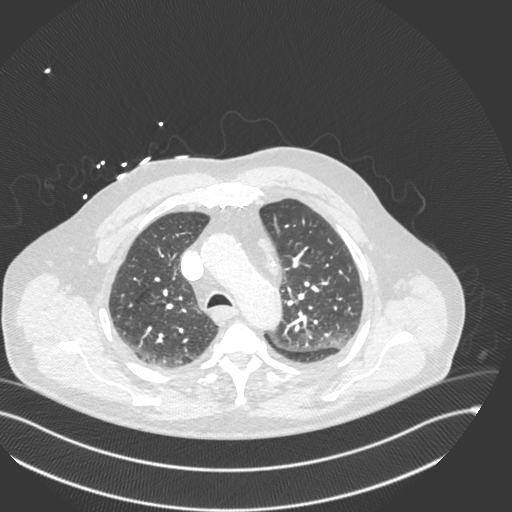
[im 348/448  soft-tissue]
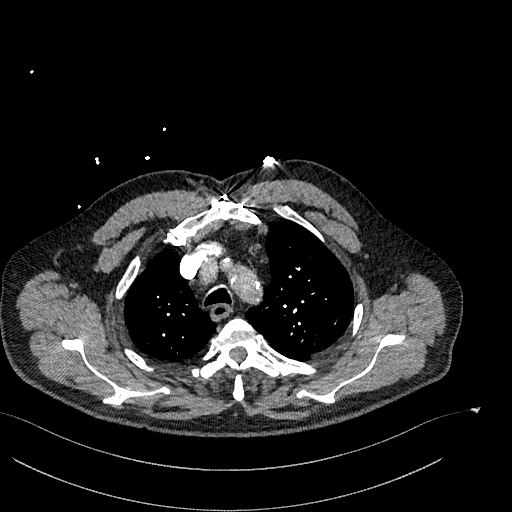
[im 398/448  lung]
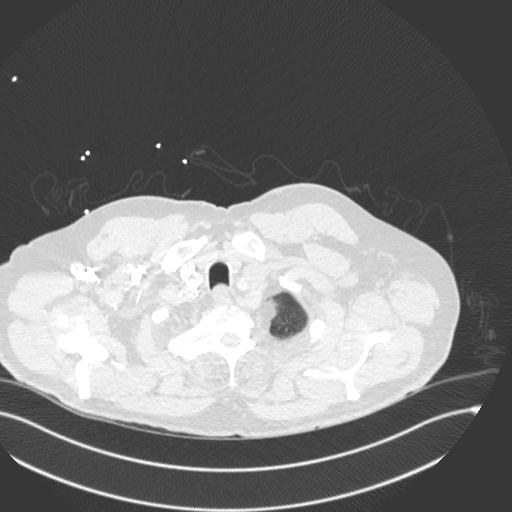
[im 423/448  soft-tissue]
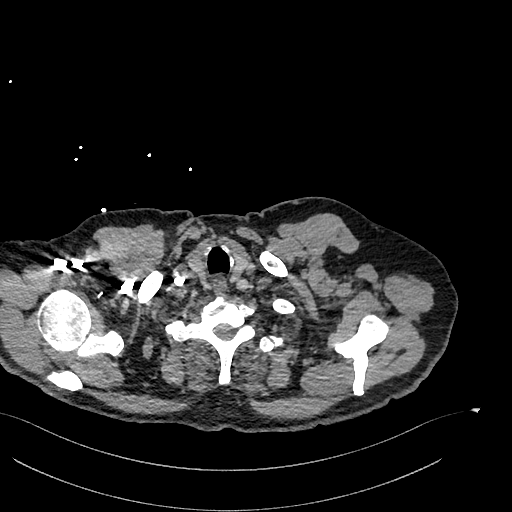

[Series 8: cor · coronal · 0.66mm/px · 3 of 168 slices shown]
[im 42/168  soft-tissue]
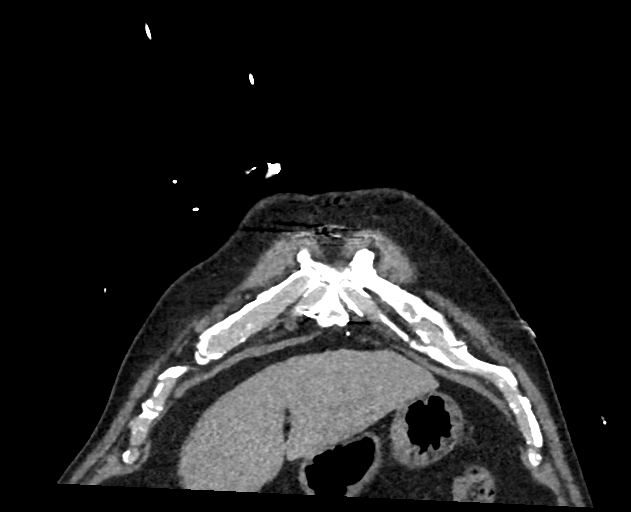
[im 84/168  soft-tissue]
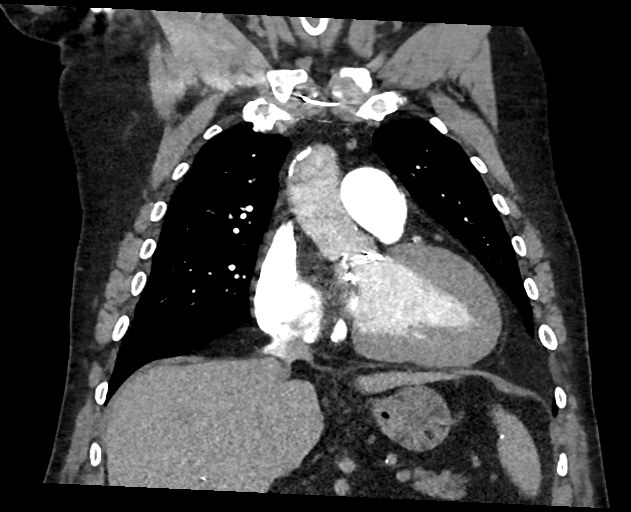
[im 126/168  soft-tissue]
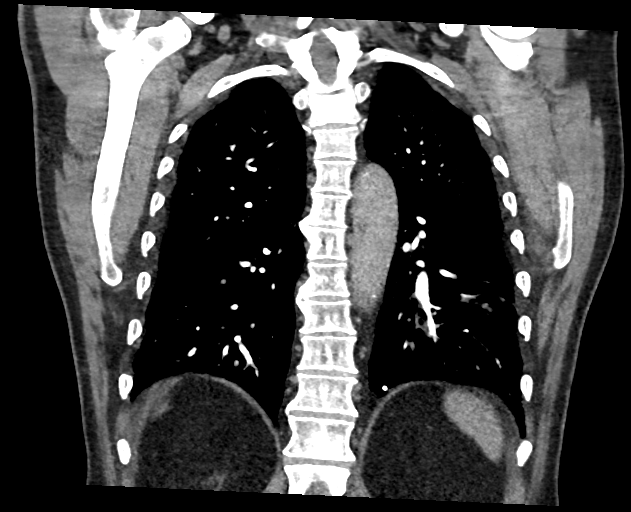

[17 of 46 positions shown; findings below may reference images not displayed]

FINDINGS: Cardiovascular: There are no filling defects within the pulmonary
arteries to suggest pulmonary embolus. Dilatation of the main
pulmonary artery at 3.6 cm. Post median sternotomy and CABG. Diffuse
thoracic aortic atherosclerosis. Cannot assess for dissection given
phase of contrast tailored to pulmonary artery evaluation. Mild
cardiomegaly. Aortic valve replacement. There are coronary artery
calcifications. No pericardial effusion.

Mediastinum/Nodes: Sequela prior granulomatous disease with
calcified mediastinal and hilar lymph nodes. There is a 9 mm
noncalcified right lower paratracheal node, similar to prior. No
esophageal wall thickening. No visualized thyroid nodule.

Lungs/Pleura: Mild emphysema. There is moderate bronchial
thickening, most prominent involving the lower lobes were there is
areas of mucous plugging and mucoid impaction. Left lower lobe
pulmonary mass measures 3.7 x 2.0 cm, series 7, image 90 unchanged
from recent PET. The adjacent left lower lobe nodule on PET is less
well-defined on the current exam due to adjacent atelectasis,
measuring approximately 11 mm currently series 7, image 99. There is
increasing surrounding atelectasis. The low right posterior lower
lobe nodule on PET is partially obscured by motion on the current
exam, series 7, image 125. Multiple calcified granuloma. No new
pulmonary nodule or airspace disease. No pleural fluid.

Upper Abdomen: Assessed on concurrent abdominopelvic CT, reported
separately.

Musculoskeletal: Median sternotomy. No acute osseous abnormalities.
No focal bone lesion.

Review of the MIP images confirms the above findings.
IMPRESSION: 1. No pulmonary embolus.
2. Moderate bronchial thickening, most prominent involving the lower
lobes were there are areas of mucous plugging and mucoid impaction.
3. Left lower lobe pulmonary mass measuring 3.7 x 2.0 cm, unchanged
from recent PET-CT. The adjacent left lower lobe nodule on PET is
less well-defined on the current exam due to adjacent atelectasis.
The low right lower lobe nodule on PET is partially obscured by
motion on the current exam. Differential considerations include
primary bronchogenic malignancy or masslike pneumonia. Continued
follow-up and additional investigation is likely warranted. Consider
multi disciplinary thoracic peripheral as indicated.
4. Cardiomegaly with aortic valve replacement. Dilatation of the
main pulmonary artery suggesting pulmonary arterial hypertension.
5. Aortic atherosclerosis and coronary artery calcifications.
6. Sequela prior granulomatous disease.

Aortic Atherosclerosis (SBIGZ-EY0.0) and Emphysema (SBIGZ-F39.0).

## 2021-11-14 ENCOUNTER — Telehealth: Payer: Self-pay | Admitting: *Deleted

## 2021-11-14 ENCOUNTER — Other Ambulatory Visit: Payer: Self-pay | Admitting: Oncology

## 2021-11-14 NOTE — Telephone Encounter (Signed)
Daughter Lincoln Brigham called office 11/13/21 - LVM stating: Dr. Orlene Erm said patient needed more shots. Attempted to contact Ms. Richardson without success on 11/13/21.   Received Telephone Advice fax from after hours [facility] in AM 11/14/21.  Call date/time on fax 11/13/21; 1720.  Caller was Ms. Marvel Plan - Concerns noted by [facility] RN:  Father has been having chemo/radiation, in a lot of pain, above the pelvis. 8/10 pain. Trial hormone therapy. Finished Radiation treatment in November. Just d/cd from [facility] Sunday for fluid around his heart.His BMs have a lot of mucus and are odorous.    Access [facility] RN advised closest ED. She contacted [facility] On Call provider and MD was in agreement to send patient to closest ED - [place] AM 11/14/21 - reviewed [facility] report w/her. She states info correct regarding patient last night. Father is currently admitted to [facility] for diverticulitis and heart failure. She stated that Dr. Orlene Erm was the radiation oncologist at [facility] and father had 59 radiation treatments. She repeated that Dr. Orlene Erm said her father needed more shots.   Asked Ms. Marvel Plan why patient did not return after appt with Dr. Alen Blew on 04/18/21 and injection 04/25/21.  He did not come for August appt - and no f/u appt re-scheduled. Daughter stated she did not know about that appt.and thought her father was only to receive the one injection in June. She asked when Dr. Alen Blew would want to see her father.   Advised her that since her father is hospitalized at this time, will send her question to Dr. Alen Blew and find out what type of appointments are needed and when to schedule them.  Dr. Alen Blew informed of patient current hospitalization and daughter's statement regarding him needing more shots and request for him to be seen.

## 2021-11-15 ENCOUNTER — Telehealth: Payer: Self-pay | Admitting: *Deleted

## 2021-11-15 NOTE — Telephone Encounter (Signed)
PC to patient's daughter, Lincoln Brigham, informed her of patient's appointments on 12/03/21, she verbalizes understanding.  She states patient remains in [facility] at this time.

## 2021-12-03 ENCOUNTER — Inpatient Hospital Stay: Payer: Medicare HMO

## 2021-12-03 ENCOUNTER — Inpatient Hospital Stay (HOSPITAL_BASED_OUTPATIENT_CLINIC_OR_DEPARTMENT_OTHER): Payer: Medicare HMO | Admitting: Oncology

## 2021-12-03 ENCOUNTER — Telehealth: Payer: Self-pay | Admitting: *Deleted

## 2021-12-03 ENCOUNTER — Inpatient Hospital Stay: Payer: Medicare HMO | Attending: Oncology

## 2021-12-03 ENCOUNTER — Other Ambulatory Visit: Payer: Self-pay

## 2021-12-03 VITALS — BP 144/54 | HR 73 | Temp 98.1°F | Resp 18 | Wt 216.8 lb

## 2021-12-03 DIAGNOSIS — C61 Malignant neoplasm of prostate: Secondary | ICD-10-CM | POA: Diagnosis not present

## 2021-12-03 LAB — CBC WITH DIFFERENTIAL (CANCER CENTER ONLY)
Abs Immature Granulocytes: 0.04 10*3/uL (ref 0.00–0.07)
Basophils Absolute: 0 10*3/uL (ref 0.0–0.1)
Basophils Relative: 1 %
Eosinophils Absolute: 0.3 10*3/uL (ref 0.0–0.5)
Eosinophils Relative: 4 %
HCT: 29.2 % — ABNORMAL LOW (ref 39.0–52.0)
Hemoglobin: 9.1 g/dL — ABNORMAL LOW (ref 13.0–17.0)
Immature Granulocytes: 1 %
Lymphocytes Relative: 9 %
Lymphs Abs: 0.6 10*3/uL — ABNORMAL LOW (ref 0.7–4.0)
MCH: 29.4 pg (ref 26.0–34.0)
MCHC: 31.2 g/dL (ref 30.0–36.0)
MCV: 94.2 fL (ref 80.0–100.0)
Monocytes Absolute: 0.5 10*3/uL (ref 0.1–1.0)
Monocytes Relative: 8 %
Neutro Abs: 5.2 10*3/uL (ref 1.7–7.7)
Neutrophils Relative %: 77 %
Platelet Count: 216 10*3/uL (ref 150–400)
RBC: 3.1 MIL/uL — ABNORMAL LOW (ref 4.22–5.81)
RDW: 17.4 % — ABNORMAL HIGH (ref 11.5–15.5)
WBC Count: 6.8 10*3/uL (ref 4.0–10.5)
nRBC: 0 % (ref 0.0–0.2)

## 2021-12-03 LAB — CMP (CANCER CENTER ONLY)
ALT: 7 U/L (ref 0–44)
AST: 11 U/L — ABNORMAL LOW (ref 15–41)
Albumin: 3.6 g/dL (ref 3.5–5.0)
Alkaline Phosphatase: 66 U/L (ref 38–126)
Anion gap: 6 (ref 5–15)
BUN: 27 mg/dL — ABNORMAL HIGH (ref 8–23)
CO2: 31 mmol/L (ref 22–32)
Calcium: 9.2 mg/dL (ref 8.9–10.3)
Chloride: 102 mmol/L (ref 98–111)
Creatinine: 1.32 mg/dL — ABNORMAL HIGH (ref 0.61–1.24)
GFR, Estimated: 56 mL/min — ABNORMAL LOW (ref >60)
Glucose, Bld: 169 mg/dL — ABNORMAL HIGH (ref 70–99)
Potassium: 4 mmol/L (ref 3.5–5.1)
Sodium: 139 mmol/L (ref 135–145)
Total Bilirubin: 0.5 mg/dL (ref 0.3–1.2)
Total Protein: 6.8 g/dL (ref 6.5–8.1)

## 2021-12-03 MED ORDER — LEUPROLIDE ACETATE (6 MONTH) 45 MG ~~LOC~~ KIT
45.0000 mg | PACK | Freq: Once | SUBCUTANEOUS | Status: AC
  Administered 2021-12-03: 45 mg via SUBCUTANEOUS
  Filled 2021-12-03: qty 45

## 2021-12-03 NOTE — Progress Notes (Signed)
Hematology and Oncology Follow Up Visit  Eric Fischer [id number] 09-17-1946 76 y.o. 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  He presented with a PSA of 61 and pelvic adenopathy.  He initially presented with a Gleason score of 6 and active surveillance in 2012.   Prior Therapy:   Firmagon 240 mg given on April 25, 2021.  He is status post radiation therapy to the prostate completed in October 2022.  He received total of 45 Gray in 25 fractions to the pelvic nodes of the prostate and prostate boost 3000 cGy in 15 fractions.  This was completed at [facility]   Current therapy: Eligard 45 mg every 6 months.  Next injection will be given on December 03, 2021.  Interim History: Mr. Leinbach returns today for a follow-up visit.  Since the last visit, he completed radiation therapy to the prostate and pelvic lymph nodes concluded in October 2022.  He had tolerated the therapy very well but has other multiple comorbid conditions including recent diagnosis of diverticulitis as well as peripheral vascular disease and claudications.  His mobility has been limited because of claudication and performance status is adequate.     Medications: I have reviewed the patient's current medications.  Current Outpatient Medications  Medication Sig Dispense Refill   albuterol (VENTOLIN HFA) 108 (90 Base) MCG/ACT inhaler Inhale into the lungs.     amoxicillin-clavulanate (AUGMENTIN) 875-125 MG tablet Take 1 tablet by mouth 2 (two) times daily. One po bid x 7 days (Patient not taking: Reported on 05/21/2021) 14 tablet 0   aspirin 81 MG EC tablet Take by mouth. (Patient not taking: Reported on 05/21/2021)     atorvastatin (LIPITOR) 40 MG tablet Take by mouth. (Patient not taking: Reported on 05/21/2021)     furosemide (LASIX) 40 MG tablet Take by mouth.     gabapentin (NEURONTIN) 300 MG  capsule Take by mouth.     insulin glargine (LANTUS) 100 UNIT/ML Solostar Pen Inject into the skin.     linagliptin (TRADJENTA) 5 MG TABS tablet Take 1 tablet by mouth daily. (Patient not taking: Reported on 05/21/2021)     losartan (COZAAR) 25 MG tablet Take by mouth. (Patient not taking: Reported on 05/21/2021)     meloxicam (MOBIC) 15 MG tablet Take 1 tablet by mouth daily. (Patient not taking: Reported on 05/21/2021)     metFORMIN (GLUCOPHAGE) 500 MG tablet Take by mouth.     metoprolol succinate (TOPROL-XL) 50 MG 24 hr tablet Take by mouth.     traMADol (ULTRAM) 50 MG tablet Take 1 tablet (50 mg total) by mouth every 6 (six) hours as needed for moderate pain. 20 tablet 0   umeclidinium-vilanterol (ANORO ELLIPTA) 62.5-25 MCG/INH AEPB Inhale 1 puff into the lungs daily.     No current facility-administered medications for this visit.     Allergies: No Known Allergies   Physical Exam: Blood pressure (!) 144/54, pulse 73, temperature 98.1 F (36.7 C), resp. rate 18, weight 216 lb 12.8 oz (98.3 kg), SpO2 92 %.  ECOG: 2    General appearance: Comfortable appearing without any discomfort Head: Normocephalic without any trauma Oropharynx: Mucous membranes are moist and pink without any thrush or ulcers. Eyes: Pupils are equal and round reactive to light. Lymph nodes: No cervical, supraclavicular, inguinal or axillary lymphadenopathy.   Heart:regular rate and rhythm.  S1 and S2 without leg edema. Lung: Clear  without any rhonchi or wheezes.  No dullness to percussion. Abdomin: Soft, nontender, nondistended with good bowel sounds.  No hepatosplenomegaly. Musculoskeletal: No joint deformity or effusion.  Full range of motion noted. Neurological: No deficits noted on motor, sensory and deep tendon reflex exam. Skin: No petechial rash or dryness.  Appeared moist.      Lab Results: Lab Results  Component Value Date   WBC 6.9 04/30/2021   HGB 14.4 04/30/2021   HCT 44.2 04/30/2021    MCV 91.9 04/30/2021   PLT 201 04/30/2021     Chemistry      Component Value Date/Time   NA 133 (L) 04/30/2021 1452   K 5.0 04/30/2021 1452   CL 99 04/30/2021 1452   CO2 28 04/30/2021 1452   BUN 26 (H) 04/30/2021 1452   CREATININE 1.32 (H) 04/30/2021 1452      Component Value Date/Time   CALCIUM 9.5 04/30/2021 1452   ALKPHOS 71 04/30/2021 1452   AST 16 04/30/2021 1452   ALT 11 04/30/2021 1452   BILITOT 0.6 04/30/2021 1452           Impression and Plan:   76 year old man with:  1.  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     His disease status was updated at this time and treatment results were reviewed.  He received androgen deprivation therapy concomitantly with radiation to the prostate and regional lymph nodes.  Risks and benefits of therapy escalation utilizing abiraterone or enzalutamide or systemic chemotherapy were reiterated.  After discussion today, he opted against any additional therapy escalation given his multiple comorbidities and fear of side effects associated with these treatments.  Fluid retention as well as peripheral vascular disease has been problematic for him at this time.  I recommended continued close monitoring and add additional therapy if his disease progresses.     2.  Androgen deprivation therapy: He will receive Eligard today and repeated in 6 months.  Complication clinic weight gain, hot flashes and others were reiterated.  He is agreeable to proceed.   3.  Bone directed therapy: Risk of osteoporosis associated with androgen deprivation were reiterated.  I recommended calcium and vitamin D supplements.  4.  Lower urinary tract symptoms: Symptoms improved at this time.  5.  Follow-up: In 6 months for repeat follow-up.   30  minutes were spent on this encounter.  The time was dedicated to reviewing laboratory data, disease status update, treatment choices  and future plan of care review.      Zola Button, MD 2/6/202310:26 AM

## 2021-12-03 NOTE — Telephone Encounter (Signed)
PATIENT SAW DR. PALERMO IN  FOR CONSULTATION ON 10-03-21

## 2021-12-04 LAB — PROSTATE-SPECIFIC AG, SERUM (LABCORP): Prostate Specific Ag, Serum: 0.2 ng/mL (ref 0.0–4.0)

## 2022-02-25 DEATH — deceased

## 2022-05-28 ENCOUNTER — Encounter: Payer: Self-pay | Admitting: Oncology

## 2022-06-04 ENCOUNTER — Inpatient Hospital Stay: Payer: Medicare HMO

## 2022-06-04 ENCOUNTER — Inpatient Hospital Stay: Payer: Medicare HMO | Attending: Family Medicine

## 2022-06-04 ENCOUNTER — Inpatient Hospital Stay: Payer: Medicare HMO | Admitting: Oncology

## 2022-06-11 ENCOUNTER — Ambulatory Visit: Payer: Self-pay | Admitting: Oncology

## 2022-06-11 ENCOUNTER — Other Ambulatory Visit: Payer: Self-pay

## 2022-06-11 ENCOUNTER — Ambulatory Visit: Payer: Self-pay
